# Patient Record
Sex: Female | Born: 1960 | Race: Black or African American | Hispanic: No | Marital: Single | Smoking: Never smoker
Health system: Southern US, Community
[De-identification: ages and names within clinical notes are randomized; demographics above are authoritative.]

## PROBLEM LIST (undated history)

## (undated) DIAGNOSIS — E079 Disorder of thyroid, unspecified: Secondary | ICD-10-CM

## (undated) HISTORY — PX: ABDOMINAL HYSTERECTOMY: SHX81

---

## 2007-08-10 DIAGNOSIS — I493 Ventricular premature depolarization: Secondary | ICD-10-CM | POA: Insufficient documentation

## 2008-07-15 DIAGNOSIS — D241 Benign neoplasm of right breast: Secondary | ICD-10-CM | POA: Insufficient documentation

## 2011-02-16 DIAGNOSIS — H9193 Unspecified hearing loss, bilateral: Secondary | ICD-10-CM | POA: Insufficient documentation

## 2011-04-20 DIAGNOSIS — E042 Nontoxic multinodular goiter: Secondary | ICD-10-CM | POA: Insufficient documentation

## 2011-04-20 DIAGNOSIS — Z90711 Acquired absence of uterus with remaining cervical stump: Secondary | ICD-10-CM | POA: Insufficient documentation

## 2011-10-26 DIAGNOSIS — G4733 Obstructive sleep apnea (adult) (pediatric): Secondary | ICD-10-CM | POA: Insufficient documentation

## 2012-03-14 DIAGNOSIS — J310 Chronic rhinitis: Secondary | ICD-10-CM | POA: Insufficient documentation

## 2014-09-12 DIAGNOSIS — I700 Atherosclerosis of aorta: Secondary | ICD-10-CM | POA: Insufficient documentation

## 2016-11-08 DIAGNOSIS — E039 Hypothyroidism, unspecified: Secondary | ICD-10-CM | POA: Insufficient documentation

## 2017-02-07 DIAGNOSIS — E782 Mixed hyperlipidemia: Secondary | ICD-10-CM | POA: Insufficient documentation

## 2019-07-06 ENCOUNTER — Encounter (HOSPITAL_BASED_OUTPATIENT_CLINIC_OR_DEPARTMENT_OTHER): Payer: Self-pay | Admitting: Emergency Medicine

## 2019-07-06 ENCOUNTER — Emergency Department (HOSPITAL_BASED_OUTPATIENT_CLINIC_OR_DEPARTMENT_OTHER)
Admission: EM | Admit: 2019-07-06 | Discharge: 2019-07-06 | Disposition: A | Discharge status: Home / Self Care | Payer: PRIVATE HEALTH INSURANCE | Attending: Emergency Medicine | Admitting: Emergency Medicine

## 2019-07-06 ENCOUNTER — Other Ambulatory Visit: Payer: Self-pay

## 2019-07-06 ENCOUNTER — Emergency Department (HOSPITAL_BASED_OUTPATIENT_CLINIC_OR_DEPARTMENT_OTHER): Payer: PRIVATE HEALTH INSURANCE

## 2019-07-06 DIAGNOSIS — Z79899 Other long term (current) drug therapy: Secondary | ICD-10-CM | POA: Diagnosis not present

## 2019-07-06 DIAGNOSIS — R002 Palpitations: Secondary | ICD-10-CM | POA: Insufficient documentation

## 2019-07-06 DIAGNOSIS — E039 Hypothyroidism, unspecified: Secondary | ICD-10-CM | POA: Insufficient documentation

## 2019-07-06 HISTORY — DX: Disorder of thyroid, unspecified: E07.9

## 2019-07-06 LAB — CBC
HCT: 42.1 % (ref 36.0–46.0)
Hemoglobin: 14.5 g/dL (ref 12.0–15.0)
MCH: 31.3 pg (ref 26.0–34.0)
MCHC: 34.4 g/dL (ref 30.0–36.0)
MCV: 90.9 fL (ref 80.0–100.0)
Platelets: 189 10*3/uL (ref 150–400)
RBC: 4.63 MIL/uL (ref 3.87–5.11)
RDW: 12.9 % (ref 11.5–15.5)
WBC: 4.7 10*3/uL (ref 4.0–10.5)
nRBC: 0 % (ref 0.0–0.2)

## 2019-07-06 LAB — RAPID URINE DRUG SCREEN, HOSP PERFORMED
Amphetamines: NOT DETECTED
Barbiturates: NOT DETECTED
Benzodiazepines: NOT DETECTED
Cocaine: NOT DETECTED
Opiates: NOT DETECTED
Tetrahydrocannabinol: NOT DETECTED

## 2019-07-06 LAB — BASIC METABOLIC PANEL
Anion gap: 12 (ref 5–15)
BUN: 11 mg/dL (ref 6–20)
CO2: 25 mmol/L (ref 22–32)
Calcium: 9.6 mg/dL (ref 8.9–10.3)
Chloride: 105 mmol/L (ref 98–111)
Creatinine, Ser: 0.82 mg/dL (ref 0.44–1.00)
GFR calc Af Amer: >60 mL/min (ref >60)
GFR calc non Af Amer: >60 mL/min (ref >60)
Glucose, Bld: 113 mg/dL — ABNORMAL HIGH (ref 70–99)
Potassium: 3.5 mmol/L (ref 3.5–5.1)
Sodium: 142 mmol/L (ref 135–145)

## 2019-07-06 LAB — TROPONIN I (HIGH SENSITIVITY)
Troponin I (High Sensitivity): 3 ng/L (ref <18)
Troponin I (High Sensitivity): 4 ng/L (ref <18)

## 2019-07-06 LAB — URINALYSIS, ROUTINE W REFLEX MICROSCOPIC
Bilirubin Urine: NEGATIVE
Glucose, UA: NEGATIVE mg/dL
Ketones, ur: NEGATIVE mg/dL
Leukocytes,Ua: NEGATIVE
Nitrite: NEGATIVE
Protein, ur: NEGATIVE mg/dL
Specific Gravity, Urine: 1.01 (ref 1.005–1.030)
pH: 5.5 (ref 5.0–8.0)

## 2019-07-06 LAB — URINALYSIS, MICROSCOPIC (REFLEX)

## 2019-07-06 LAB — TSH: TSH: 0.659 u[IU]/mL (ref 0.350–4.500)

## 2019-07-06 LAB — MAGNESIUM: Magnesium: 2 mg/dL (ref 1.7–2.4)

## 2019-07-06 MED ORDER — SODIUM CHLORIDE 0.9% FLUSH
3.0000 mL | Freq: Once | INTRAVENOUS | Status: DC
  Filled 2019-07-06: qty 3

## 2019-07-06 NOTE — Discharge Instructions (Addendum)
You have been seen today for palpitations. Please read and follow all provided instructions. Return to the emergency room for worsening condition or new concerning symptoms.    Your work-up today was reassuring.  Your blood work was all normal.  Your TSH is within normal range.  1. Medications:  Continue usual home medications Take medications as prescribed. Please review all of the medicines and only take them if you do not have an allergy to them.   2. Treatment: rest, drink plenty of fluids  3. Follow Up:  Please follow up with primary care provider by scheduling an appointment as soon as possible for a visit     ?

## 2019-07-06 NOTE — ED Triage Notes (Signed)
Pt state that for the past 4 days she feels like she has had a high HR and a bounding heart rhythm. The patient denies any pain, or SOB with the palpitations. Pt is a/o x 4 - denies a cardiac hx in the past

## 2019-07-06 NOTE — ED Provider Notes (Signed)
[facility] EMERGENCY DEPARTMENT Provider Note   CSN: [id number] Arrival date & time: 07/06/19  1330     History Chief Complaint  Patient presents with  . Palpitations    Melinda Wilson is a 59 y.o. female with past medical history significant for thyroid disease, premature ventricular beats presents to emergency department today with chief complaint of intermittent palpitations x4 days.  Patient states the she has had multiple episodes of palpitations since symptoms onset. She states palpitations happen at rest usually. They last approximately 2 minutes. She states she feels like her heart is beating very fast and like it is in a pound out of her chest. They resolve without any intervention. Yesterday when walking she had an episode of epigastric pain. She said it felt like heartburn. It only lasted a few minutes. She did not take any medications for symptoms prior to arrival. Patient states she usually does not drink any caffeine, however has had small amounts of Coca[employer]a twice in the last week.  She denies fever, chills, cough, shortness of breath, syncope, chest pain, abdominal pain, nausea, vomiting, urinary symptoms, diarrhea.  Patient admits to seeing cardiologist in [place] in 2017 for palpitations. She wore a Holter monitor and reports there were no significant findings. It was around that time she was diagnosed with hyperthyroid is him. She underwent radioactive idoine ablation and later had hypothyroidism. Currently takes levothyroxine 75 mcg. She states she had TSH checked 1 month ago and it was within normal range.  History provided by patient with additional history obtained from chart review.     Past Medical History:  Diagnosis Date  . Thyroid disease     Patient Active Problem List   Diagnosis Date Noted  . Mixed hyperlipidemia 02/07/2017  . Hypothyroidism 11/08/2016  . Hardening of the aorta (main artery of the heart) ([place]) 09/12/2014  . 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 Obstructive sleep apnea 10/26/2011  . 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 Unspecified hearing loss, bilateral 02/16/2011  . 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 Premature ventricular beats 08/10/2007    Past Surgical History:  Procedure Laterality Date  . ABDOMINAL HYSTERECTOMY       OB History   No obstetric history on file.     History reviewed. No pertinent family history.  Social History   Tobacco Use  . Smoking status: Never Smoker  . Smokeless tobacco: Never Used  Substance Use Topics  . Alcohol use: Never  . Drug use: Never    Home Medications Prior to Admission medications   Medication Sig Start Date End Date Taking? Authorizing Provider  levothyroxine (SYNTHROID) 75 MCG tablet Take 1 tablet by mouth every morning for 5 days and one-half tablet for 2 days of each week. 06/09/19 09/18/19 Yes [provider]    Allergies    Patient has no known allergies.  Review of Systems   Review of Systems All other systems are reviewed and are negative for acute change except as noted in the HPI.  Physical Exam Updated Vital Signs BP 108/85 (BP Location: Right Arm)   Pulse 96   Resp 16   Ht 5\' 4"  (1.626 m)   Wt 63 kg   SpO2 100%   BMI 23.86 kg/m   Physical Exam Vitals and nursing note reviewed.  Constitutional:      General: She is not in acute distress.    Appearance: She is not  ill-appearing.  HENT:     Head: Normocephalic and atraumatic.     Right Ear: Tympanic membrane and external ear normal.     Left Ear: Tympanic membrane and external ear normal.     Nose: Nose normal.     Mouth/Throat:     Mouth: Mucous membranes are moist.     Pharynx: Oropharynx is clear.  Eyes:     General: No scleral icterus.       Right eye: No discharge.        Left eye: No discharge.     Extraocular Movements: Extraocular movements intact.      Conjunctiva/sclera: Conjunctivae normal.     Pupils: Pupils are equal, round, and reactive to light.  Neck:     Vascular: No JVD.  Cardiovascular:     Rate and Rhythm: Normal rate and regular rhythm.     Pulses: Normal pulses.          Radial pulses are 2+ on the right side and 2+ on the left side.     Heart sounds: Normal heart sounds. No murmur.  Pulmonary:     Comments: Lungs clear to auscultation in all fields. Symmetric chest rise. No wheezing, rales, or rhonchi. Abdominal:     Comments: Abdomen is soft, non-distended, and non-tender in all quadrants. No rigidity, no guarding. No peritoneal signs.  Musculoskeletal:        General: Normal range of motion.     Cervical back: Normal range of motion.     Right lower leg: No edema.     Left lower leg: No edema.  Skin:    General: Skin is warm and dry.     Capillary Refill: Capillary refill takes less than 2 seconds.  Neurological:     Mental Status: She is oriented to person, place, and time.     GCS: GCS eye subscore is 4. GCS verbal subscore is 5. GCS motor subscore is 6.     Comments: Fluent speech, no facial droop.  Psychiatric:        Behavior: Behavior normal.     ED Results / Procedures / Treatments   Labs (all labs ordered are listed, but only abnormal results are displayed) Labs Reviewed  BASIC METABOLIC PANEL - Abnormal; Notable for the following components:      Result Value   Glucose, Bld 113 (*)    All other components within normal limits  URINALYSIS, ROUTINE W REFLEX MICROSCOPIC - Abnormal; Notable for the following components:   Hgb urine dipstick TRACE (*)    All other components within normal limits  URINALYSIS, MICROSCOPIC (REFLEX) - Abnormal; Notable for the following components:   Bacteria, UA RARE (*)    All other components within normal limits  MAGNESIUM  TSH  RAPID URINE DRUG SCREEN, HOSP PERFORMED  CBC  TROPONIN I (HIGH SENSITIVITY)  TROPONIN I (HIGH SENSITIVITY)    EKG EKG  Interpretation  Date/Time:  Saturday July 06 2019 13:44:35 EDT Ventricular Rate:  99 PR Interval:    QRS Duration: 85 QT Interval:  363 QTC Calculation: 466 R Axis:   34 Text Interpretation: Sinus rhythm Right atrial enlargement Nonspecific T abnrm, anterolateral leads Baseline wander in lead(s) II III aVF No old tracing to compare Confirmed by Malvin Johns [phone]) on 07/06/2019 1:52:16 PM   Radiology DG Chest 2 View  Result Date: 07/06/2019 CLINICAL DATA:  59 year old female with heart palpitations EXAM: CHEST - 2 VIEW COMPARISON:  None. FINDINGS: The lungs are clear and  negative for focal airspace consolidation, pulmonary edema or suspicious pulmonary nodule. No pleural effusion or pneumothorax. Cardiac and mediastinal contours are within normal limits. No acute fracture or lytic or blastic osseous lesions. The visualized upper abdominal bowel gas pattern is unremarkable. IMPRESSION: No active cardiopulmonary disease. Electronically Signed   By: Jacqulynn Cadet M.D.   On: 07/06/2019 14:59    Procedures Procedures (including critical care time)  Medications Ordered in ED Medications  sodium chloride flush (NS) 0.9 % injection 3 mL (3 mLs Intravenous Not Given 07/06/19 1455)    ED Course  I have reviewed the triage vital signs and the nursing notes.  Pertinent labs & imaging results that were available during my care of the patient were reviewed by me and considered in my medical decision making (see chart for details).    MDM Rules/Calculators/A&P                      Patient seen and examined. Patient presents awake, alert, hemodynamically stable, afebrile, non toxic.  She is very well-appearing.  Not tachycardic.  Lungs are clear to auscultation all fields.  Normal work of breathing.  No abdominal tenderness, no peritoneal signs.  Radial pulses 2+ bilaterally. Labs showed no leukocytosis, no anemia.  No electrolyte derangement, no renal insufficiency. Delta troponin is flat.  TSH is within normal range. UA without signs of infection. UDS is negative. I viewed pt's chest xray and it does not suggest acute infectious processes. EKG without obvious ischemia, no prior to compare to.  With reassuring exam, vitals, and work up today feel that she can be discharged home with symptomatic care and advised to follow-up with PCP if symptoms persist. The patient appears reasonably screened and/or stabilized for discharge and I doubt any other medical condition or other [facility] requiring further screening, evaluation, or treatment in the ED at this time prior to discharge. The patient is safe for discharge with strict return precautions discussed.  Portions of this note were generated with [employer]. Dictation errors may occur despite best attempts at proofreading.   Final Clinical Impression(s) / ED Diagnoses Final diagnoses:  Palpitations    Rx / DC Orders ED Discharge Orders    None       Cherre Robins, PA-C 07/06/19 1845    Hayden Rasmussen, MD 07/07/19 [phone]

## 2019-07-06 NOTE — ED Notes (Signed)
Pt ambulatory with steady gait to restroom for urine specimen 

## 2021-03-20 IMAGING — CR DG CHEST 2V
2 series · 2 of 2 positions shown · non-contrast
Comparison: None.

CLINICAL DATA: 58-year-old female with heart palpitations

EXAM:
CHEST - 2 VIEW

[w chest pa]
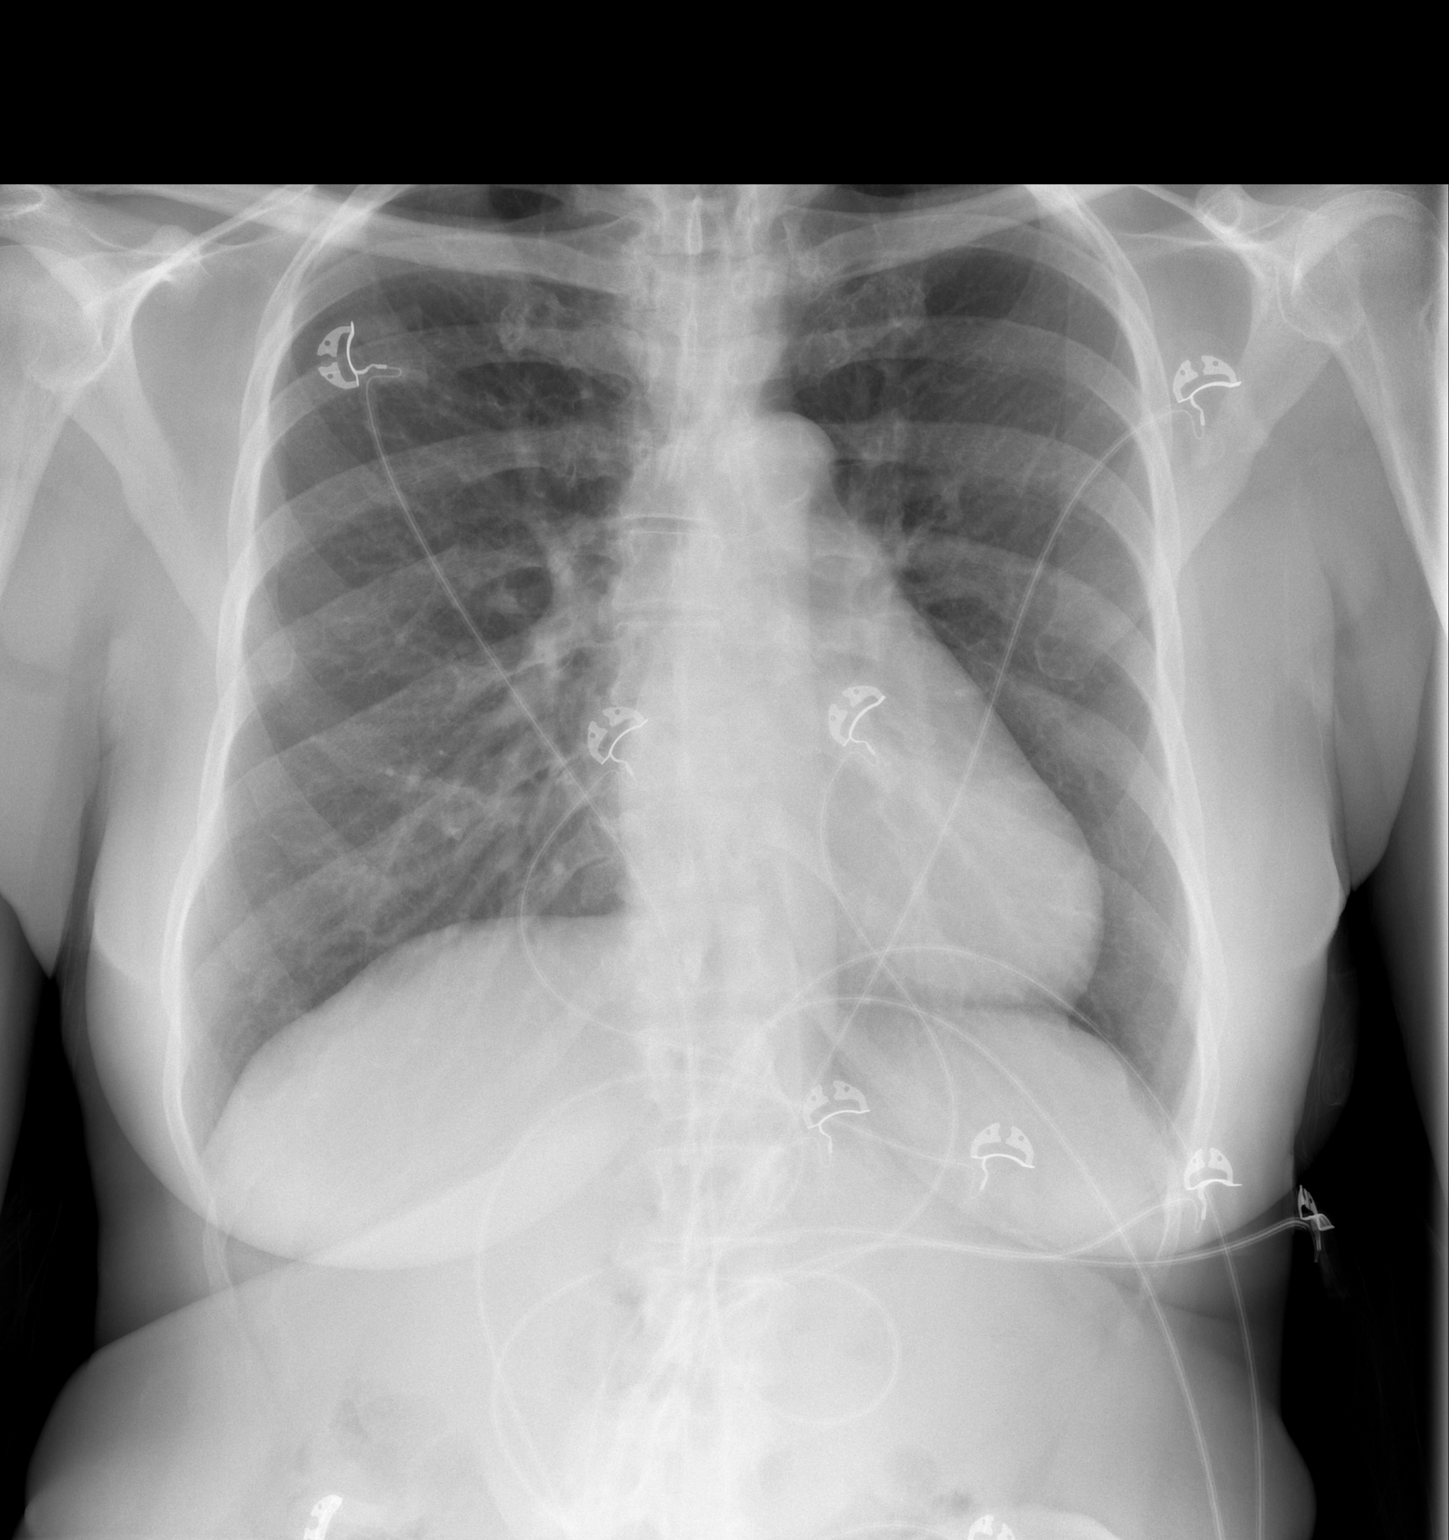

[w chest lat]
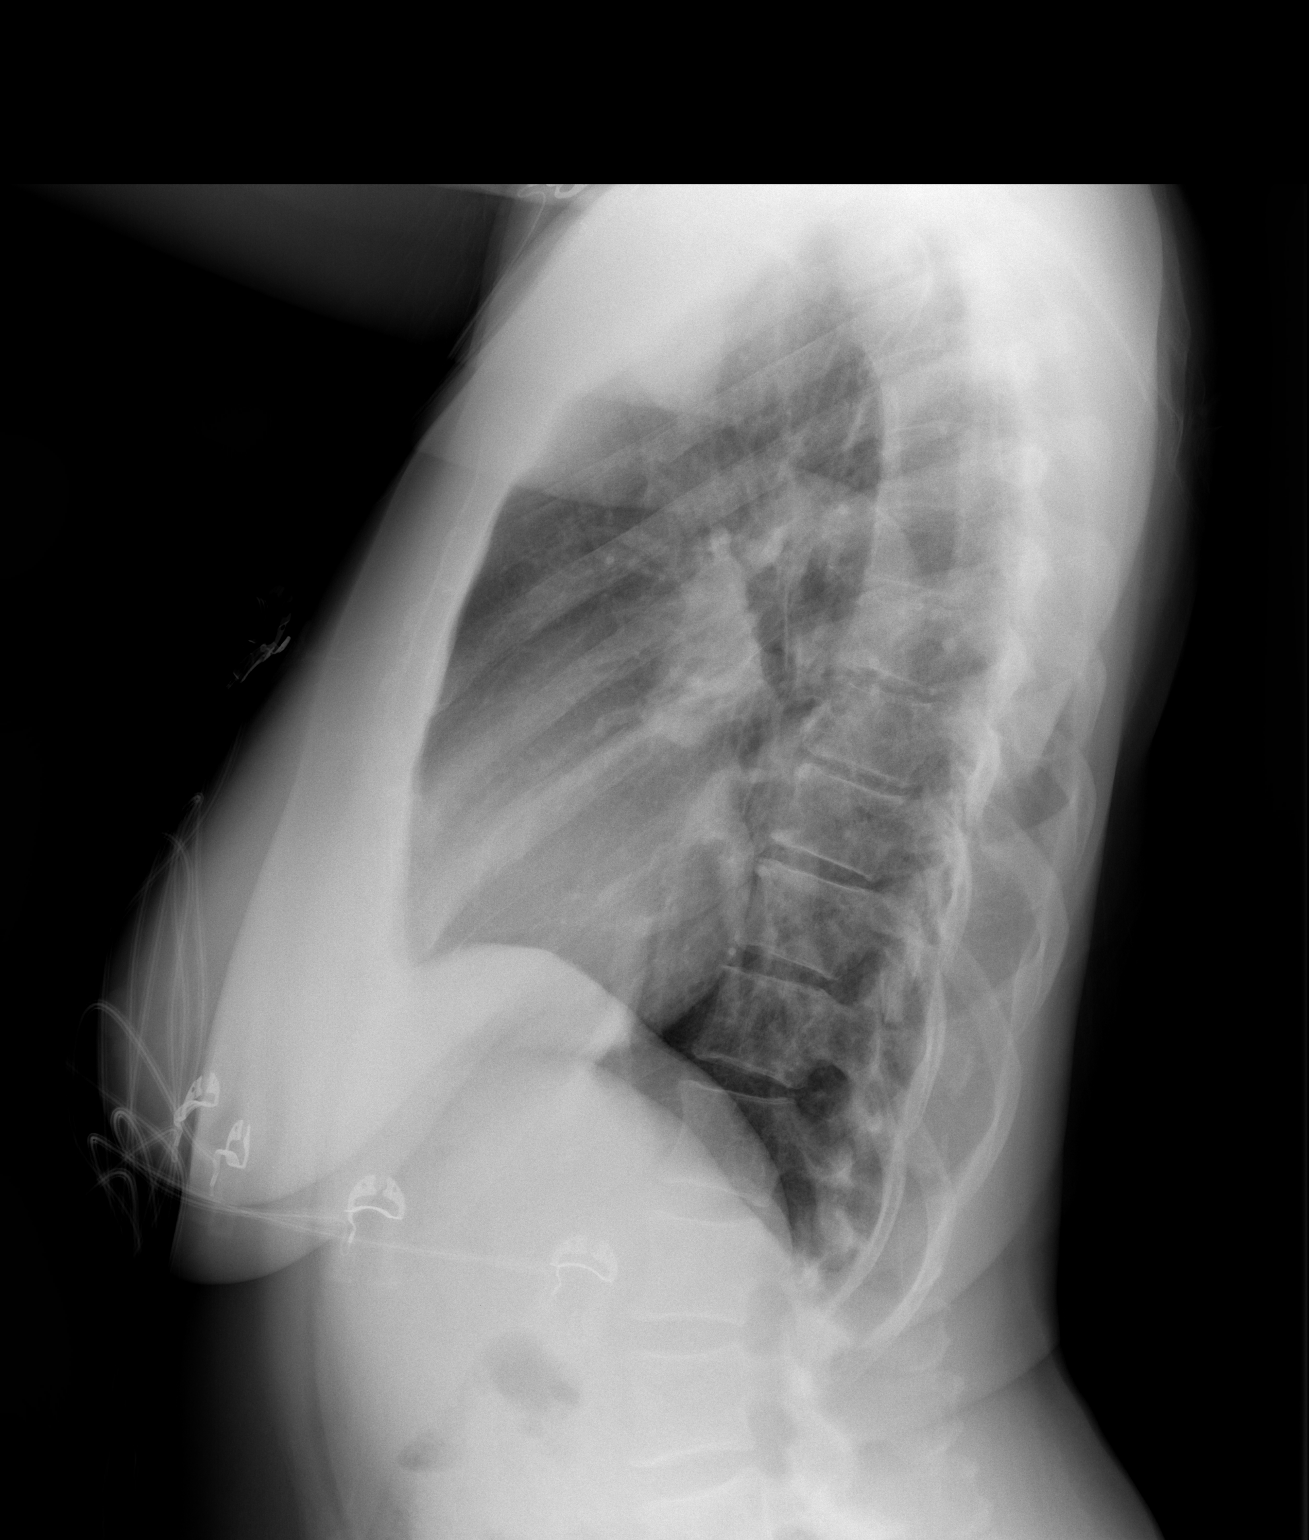

[2 of 2 positions shown; findings below may reference images not displayed]

FINDINGS: The lungs are clear and negative for focal airspace consolidation,
pulmonary edema or suspicious pulmonary nodule. No pleural effusion
or pneumothorax. Cardiac and mediastinal contours are within normal
limits. No acute fracture or lytic or blastic osseous lesions. The
visualized upper abdominal bowel gas pattern is unremarkable.
IMPRESSION: No active cardiopulmonary disease.
